# Patient Record
Sex: Male | Born: 1970 | Race: Black or African American | Hispanic: No | Marital: Single | Smoking: Current every day smoker
Health system: Southern US, Community
[De-identification: ages and names within clinical notes are randomized; demographics above are authoritative.]

## PROBLEM LIST (undated history)

## (undated) DIAGNOSIS — F419 Anxiety disorder, unspecified: Secondary | ICD-10-CM

---

## 2020-07-03 ENCOUNTER — Emergency Department (HOSPITAL_COMMUNITY): Admission: EM | Admit: 2020-07-03 | Discharge: 2020-07-04 | Payer: No Typology Code available for payment source

## 2020-07-03 NOTE — ED Notes (Signed)
No answer for triage x3, unable to locate in waiting room/outside 

## 2020-07-04 ENCOUNTER — Emergency Department (HOSPITAL_COMMUNITY)
Admission: EM | Admit: 2020-07-04 | Discharge: 2020-07-04 | Disposition: A | Discharge status: Home / Self Care | Payer: Medicaid Other | Attending: Emergency Medicine | Admitting: Emergency Medicine

## 2020-07-04 ENCOUNTER — Emergency Department (HOSPITAL_COMMUNITY): Payer: Medicaid Other

## 2020-07-04 ENCOUNTER — Encounter (HOSPITAL_COMMUNITY): Payer: Self-pay | Admitting: Emergency Medicine

## 2020-07-04 ENCOUNTER — Other Ambulatory Visit: Payer: Self-pay

## 2020-07-04 DIAGNOSIS — F17200 Nicotine dependence, unspecified, uncomplicated: Secondary | ICD-10-CM | POA: Diagnosis not present

## 2020-07-04 DIAGNOSIS — M546 Pain in thoracic spine: Secondary | ICD-10-CM | POA: Insufficient documentation

## 2020-07-04 DIAGNOSIS — Y92410 Unspecified street and highway as the place of occurrence of the external cause: Secondary | ICD-10-CM | POA: Insufficient documentation

## 2020-07-04 DIAGNOSIS — M545 Low back pain, unspecified: Secondary | ICD-10-CM | POA: Insufficient documentation

## 2020-07-04 DIAGNOSIS — M25561 Pain in right knee: Secondary | ICD-10-CM | POA: Diagnosis not present

## 2020-07-04 DIAGNOSIS — R519 Headache, unspecified: Secondary | ICD-10-CM | POA: Diagnosis not present

## 2020-07-04 DIAGNOSIS — S161XXA Strain of muscle, fascia and tendon at neck level, initial encounter: Secondary | ICD-10-CM | POA: Insufficient documentation

## 2020-07-04 DIAGNOSIS — S199XXA Unspecified injury of neck, initial encounter: Secondary | ICD-10-CM | POA: Diagnosis present

## 2020-07-04 DIAGNOSIS — T1490XA Injury, unspecified, initial encounter: Secondary | ICD-10-CM

## 2020-07-04 HISTORY — DX: Anxiety disorder, unspecified: F41.9

## 2020-07-04 LAB — I-STAT CHEM 8, ED
BUN: 14 mg/dL (ref 6–20)
Calcium, Ion: 1.16 mmol/L (ref 1.15–1.40)
Chloride: 106 mmol/L (ref 98–111)
Creatinine, Ser: 1.1 mg/dL (ref 0.61–1.24)
Glucose, Bld: 89 mg/dL (ref 70–99)
HCT: 45 % (ref 39.0–52.0)
Hemoglobin: 15.3 g/dL (ref 13.0–17.0)
Potassium: 4.3 mmol/L (ref 3.5–5.1)
Sodium: 141 mmol/L (ref 135–145)
TCO2: 26 mmol/L (ref 22–32)

## 2020-07-04 MED ORDER — CYCLOBENZAPRINE HCL 10 MG PO TABS: 10.0000 mg | ORAL_TABLET | Freq: Two times a day (BID) | ORAL | 0 refills | Status: AC | PRN

## 2020-07-04 MED ORDER — SILDENAFIL CITRATE 50 MG PO TABS: 50.0000 mg | ORAL_TABLET | Freq: Every day | ORAL | 0 refills | Status: AC | PRN

## 2020-07-04 MED ORDER — MORPHINE SULFATE (PF) 4 MG/ML IV SOLN
4.0000 mg | Freq: Once | INTRAVENOUS | Status: AC
  Administered 2020-07-04: 14:00:00 4 mg via INTRAVENOUS
  Filled 2020-07-04: qty 1

## 2020-07-04 MED ORDER — ETODOLAC 300 MG PO CAPS: 300.0000 mg | ORAL_CAPSULE | Freq: Three times a day (TID) | ORAL | 0 refills | Status: AC

## 2020-07-04 MED ORDER — OXYCODONE-ACETAMINOPHEN 5-325 MG PO TABS
1.0000 | ORAL_TABLET | ORAL | Status: DC | PRN
  Administered 2020-07-04: 1 via ORAL
  Filled 2020-07-04 (×2): qty 1
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
## 2020-07-04 NOTE — ED Notes (Signed)
No answer for triage.

## 2020-07-15 ENCOUNTER — Encounter (HOSPITAL_COMMUNITY): Payer: Self-pay

## 2020-07-15 ENCOUNTER — Emergency Department (HOSPITAL_COMMUNITY)
Admission: EM | Admit: 2020-07-15 | Discharge: 2020-07-15 | Disposition: A | Discharge status: Home / Self Care | Payer: Medicaid Other | Attending: Emergency Medicine | Admitting: Emergency Medicine

## 2020-07-15 DIAGNOSIS — R52 Pain, unspecified: Secondary | ICD-10-CM

## 2020-07-15 DIAGNOSIS — M542 Cervicalgia: Secondary | ICD-10-CM | POA: Insufficient documentation

## 2020-07-15 DIAGNOSIS — M79605 Pain in left leg: Secondary | ICD-10-CM | POA: Diagnosis not present

## 2020-07-15 DIAGNOSIS — M79604 Pain in right leg: Secondary | ICD-10-CM | POA: Insufficient documentation

## 2020-07-15 DIAGNOSIS — M545 Low back pain, unspecified: Secondary | ICD-10-CM | POA: Diagnosis present

## 2020-07-15 DIAGNOSIS — R079 Chest pain, unspecified: Secondary | ICD-10-CM | POA: Insufficient documentation

## 2020-07-15 DIAGNOSIS — M546 Pain in thoracic spine: Secondary | ICD-10-CM | POA: Diagnosis not present

## 2020-07-15 DIAGNOSIS — R519 Headache, unspecified: Secondary | ICD-10-CM | POA: Diagnosis not present

## 2020-07-15 DIAGNOSIS — F17200 Nicotine dependence, unspecified, uncomplicated: Secondary | ICD-10-CM | POA: Insufficient documentation

## 2020-07-15 MED ORDER — CYCLOBENZAPRINE HCL 10 MG PO TABS: 10.0000 mg | ORAL_TABLET | Freq: Two times a day (BID) | ORAL | 0 refills | Status: AC | PRN

## 2020-07-15 MED ORDER — HYDROCODONE-ACETAMINOPHEN 5-325 MG PO TABS
1.0000 | ORAL_TABLET | Freq: Once | ORAL | Status: AC
  Administered 2020-07-15: 14:00:00 1 via ORAL
  Filled 2020-07-15: qty 1

## 2020-07-15 MED ORDER — KETOROLAC TROMETHAMINE 60 MG/2ML IM SOLN
60.0000 mg | Freq: Once | INTRAMUSCULAR | Status: AC
  Administered 2020-07-15: 14:00:00 60 mg via INTRAMUSCULAR
  Filled 2020-07-15: qty 2

## 2020-07-15 MED ORDER — ACETAMINOPHEN 500 MG PO TABS: 1000.0000 mg | ORAL_TABLET | Freq: Four times a day (QID) | ORAL | 0 refills | Status: AC | PRN
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
## 2020-07-15 NOTE — ED Notes (Signed)
Patient verbalizes understanding of discharge instructions. Opportunity for questioning and answers were provided. Armband removed by staff, pt discharged from ED via wheelchair to lobby to wait for family to return home.

## 2021-04-23 DEATH — deceased
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
